# Patient Record
Sex: Female | Born: 1980 | Hispanic: Yes | Marital: Married
Health system: Southern US, Community
[De-identification: ages and names within clinical notes are randomized; demographics above are authoritative.]

---

## 2005-07-20 ENCOUNTER — Ambulatory Visit: Payer: Self-pay | Admitting: Nurse Practitioner

## 2005-10-24 ENCOUNTER — Ambulatory Visit: Payer: Self-pay | Admitting: Family Medicine

## 2006-03-21 ENCOUNTER — Inpatient Hospital Stay: Payer: Self-pay | Admitting: Obstetrics and Gynecology

## 2006-03-29 ENCOUNTER — Emergency Department: Payer: Self-pay | Admitting: Emergency Medicine

## 2006-04-28 ENCOUNTER — Emergency Department: Payer: Self-pay | Admitting: Emergency Medicine

## 2009-12-21 ENCOUNTER — Ambulatory Visit: Payer: Self-pay | Admitting: Family Medicine

## 2010-06-05 ENCOUNTER — Ambulatory Visit: Payer: Self-pay | Admitting: Family Medicine

## 2010-06-12 ENCOUNTER — Ambulatory Visit: Payer: Self-pay | Admitting: Family Medicine

## 2010-06-17 ENCOUNTER — Emergency Department: Payer: Self-pay | Admitting: Emergency Medicine

## 2011-07-27 ENCOUNTER — Ambulatory Visit: Payer: Self-pay | Admitting: Family Medicine

## 2011-08-29 ENCOUNTER — Ambulatory Visit: Payer: Self-pay | Admitting: Family Medicine

## 2011-10-29 ENCOUNTER — Observation Stay: Payer: Self-pay | Admitting: Obstetrics and Gynecology

## 2011-10-29 LAB — CBC WITH DIFFERENTIAL/PLATELET
HCT: 28.1 % — ABNORMAL LOW (ref 35.0–47.0)
HGB: 10.2 g/dL — ABNORMAL LOW (ref 12.0–16.0)
Lymphocytes: 23 %
MCV: 98 fL (ref 80–100)
Metamyelocyte: 2 %
Monocytes: 4 %
Myelocyte: 1 %
NRBC/100 WBC: 4 /
RBC: 2.87 10*6/uL — ABNORMAL LOW (ref 3.80–5.20)
Segmented Neutrophils: 69 %
WBC: 9.5 10*3/uL (ref 3.6–11.0)

## 2011-10-29 LAB — URINALYSIS, COMPLETE
Bilirubin,UR: NEGATIVE
Glucose,UR: 50 mg/dL (ref 0–75)
Leukocyte Esterase: NEGATIVE
Nitrite: NEGATIVE
Ph: 7 (ref 4.5–8.0)
Protein: NEGATIVE
RBC,UR: 2 /HPF (ref 0–5)
Specific Gravity: 1.01 (ref 1.003–1.030)
Squamous Epithelial: NONE SEEN

## 2012-01-10 ENCOUNTER — Ambulatory Visit: Payer: Self-pay | Admitting: Obstetrics and Gynecology

## 2012-01-10 LAB — CBC WITH DIFFERENTIAL/PLATELET
Basophil: 1 %
Eosinophil: 2 %
HCT: 31.6 % — ABNORMAL LOW (ref 35.0–47.0)
HGB: 11.2 g/dL — ABNORMAL LOW (ref 12.0–16.0)
Lymphocytes: 25 %
MCV: 101 fL — ABNORMAL HIGH (ref 80–100)
Metamyelocyte: 2 %
Monocytes: 4 %
Myelocyte: 2 %
RBC: 3.15 10*6/uL — ABNORMAL LOW (ref 3.80–5.20)
WBC: 6.8 10*3/uL (ref 3.6–11.0)

## 2012-01-11 ENCOUNTER — Inpatient Hospital Stay: Payer: Self-pay | Admitting: Obstetrics and Gynecology

## 2012-01-11 LAB — CBC WITH DIFFERENTIAL/PLATELET
Bands: 7 %
Eosinophil: 3 %
HCT: 27.7 % — ABNORMAL LOW (ref 35.0–47.0)
Lymphocytes: 20 %
MCH: 34.6 pg — ABNORMAL HIGH (ref 26.0–34.0)
MCHC: 34.5 g/dL (ref 32.0–36.0)
MCV: 100 fL (ref 80–100)
Metamyelocyte: 1 %
Monocytes: 5 %
NRBC/100 WBC: 12 /
NRBC/100 WBC: 16 /
Platelet: 158 10*3/uL (ref 150–440)
WBC: 8.3 10*3/uL (ref 3.6–11.0)

## 2012-01-11 LAB — PROTIME-INR: Prothrombin Time: 12 secs (ref 11.5–14.7)

## 2012-01-12 LAB — CBC WITH DIFFERENTIAL/PLATELET
Bands: 6 %
HGB: 7.8 g/dL — ABNORMAL LOW (ref 12.0–16.0)
MCH: 31.7 pg (ref 26.0–34.0)
MCHC: 35.2 g/dL (ref 32.0–36.0)
Monocytes: 4 %
RDW: 21.9 % — ABNORMAL HIGH (ref 11.5–14.5)
Segmented Neutrophils: 77 %
WBC: 12.3 10*3/uL — ABNORMAL HIGH (ref 3.6–11.0)

## 2012-01-12 LAB — HEMATOCRIT
HCT: 21.4 % — ABNORMAL LOW (ref 35.0–47.0)
HCT: 20.1 % — ABNORMAL LOW (ref 35.0–47.0)

## 2012-01-13 LAB — CBC WITH DIFFERENTIAL/PLATELET
Bands: 3 %
Eosinophil: 1 %
HCT: 19.8 % — ABNORMAL LOW (ref 35.0–47.0)
HGB: 7.1 g/dL — ABNORMAL LOW (ref 12.0–16.0)
Lymphocytes: 23 %
MCHC: 35.9 g/dL (ref 32.0–36.0)
Metamyelocyte: 6 %
Monocytes: 4 %
NRBC/100 WBC: 7 /
RBC: 2.21 10*6/uL — ABNORMAL LOW (ref 3.80–5.20)
RDW: 22.3 % — ABNORMAL HIGH (ref 11.5–14.5)
Segmented Neutrophils: 61 %

## 2012-01-13 LAB — BASIC METABOLIC PANEL
Anion Gap: 7 (ref 7–16)
BUN: 11 mg/dL (ref 7–18)
Co2: 26 mmol/L (ref 21–32)
EGFR (African American): >60
EGFR (Non-African Amer.): >60
Glucose: 68 mg/dL (ref 65–99)
Osmolality: 281 (ref 275–301)

## 2012-01-14 LAB — BASIC METABOLIC PANEL
Anion Gap: 7 (ref 7–16)
BUN: 7 mg/dL (ref 7–18)
Calcium, Total: 8.4 mg/dL — ABNORMAL LOW (ref 8.5–10.1)
EGFR (African American): >60
EGFR (Non-African Amer.): >60
Glucose: 66 mg/dL (ref 65–99)
Potassium: 3.7 mmol/L (ref 3.5–5.1)
Sodium: 139 mmol/L (ref 136–145)

## 2012-01-14 LAB — CBC WITH DIFFERENTIAL/PLATELET
Bands: 5 %
Eosinophil: 2 %
HCT: 26.5 % — ABNORMAL LOW (ref 35.0–47.0)
HGB: 9.3 g/dL — ABNORMAL LOW (ref 12.0–16.0)
Lymphocytes: 26 %
MCH: 31.3 pg (ref 26.0–34.0)
MCHC: 35.1 g/dL (ref 32.0–36.0)
Monocytes: 3 %
NRBC/100 WBC: 7 /
Platelet: 141 10*3/uL — ABNORMAL LOW (ref 150–440)
RBC: 2.96 10*6/uL — ABNORMAL LOW (ref 3.80–5.20)
Segmented Neutrophils: 62 %
WBC: 14.5 10*3/uL — ABNORMAL HIGH (ref 3.6–11.0)

## 2012-01-15 LAB — CBC WITH DIFFERENTIAL/PLATELET
Eosinophil: 2 %
HCT: 27.1 % — ABNORMAL LOW (ref 35.0–47.0)
MCH: 30.3 pg (ref 26.0–34.0)
MCHC: 33.5 g/dL (ref 32.0–36.0)
MCV: 91 fL (ref 80–100)
Myelocyte: 1 %
NRBC/100 WBC: 7 /
RDW: 19.1 % — ABNORMAL HIGH (ref 11.5–14.5)

## 2012-02-01 ENCOUNTER — Ambulatory Visit: Payer: Self-pay | Admitting: Internal Medicine

## 2012-02-05 LAB — CBC CANCER CENTER
Basophil #: 0.1 x10 3/mm (ref 0.0–0.1)
Eosinophil #: 0.2 x10 3/mm (ref 0.0–0.7)
HCT: 39.8 % (ref 35.0–47.0)
HGB: 13.9 g/dL (ref 12.0–16.0)
Lymphocyte #: 1.9 x10 3/mm (ref 1.0–3.6)
Lymphocyte %: 26.4 %
MCHC: 35 g/dL (ref 32.0–36.0)
Monocyte #: 0.4 x10 3/mm (ref 0.2–0.9)
Monocyte %: 5.4 %
Neutrophil #: 4.6 x10 3/mm (ref 1.4–6.5)
Neutrophil %: 63.8 %
Platelet: 240 x10 3/mm (ref 150–440)
WBC: 7.3 x10 3/mm (ref 3.6–11.0)

## 2012-02-05 LAB — PLATELET FUNCTION ASSAY: COL/EPI PLT FXN SCRN: 114 Seconds

## 2012-02-05 LAB — COMPREHENSIVE METABOLIC PANEL
Albumin: 3.7 g/dL (ref 3.4–5.0)
Anion Gap: 9 (ref 7–16)
BUN: 14 mg/dL (ref 7–18)
Calcium, Total: 9.3 mg/dL (ref 8.5–10.1)
Chloride: 103 mmol/L (ref 98–107)
Creatinine: 0.64 mg/dL (ref 0.60–1.30)
EGFR (African American): >60
EGFR (Non-African Amer.): >60
Glucose: 75 mg/dL (ref 65–99)
SGOT(AST): 18 U/L (ref 15–37)
SGPT (ALT): 30 U/L (ref 12–78)

## 2012-02-05 LAB — PROTIME-INR: INR: 1

## 2012-02-05 LAB — APTT: Activated PTT: 31.8 secs (ref 23.6–35.9)

## 2012-03-01 ENCOUNTER — Ambulatory Visit: Payer: Self-pay | Admitting: Internal Medicine

## 2012-07-29 ENCOUNTER — Ambulatory Visit: Payer: Self-pay | Admitting: Internal Medicine

## 2012-08-29 ENCOUNTER — Ambulatory Visit: Payer: Self-pay | Admitting: Internal Medicine

## 2012-11-14 IMAGING — US US OB US >=[ID] SNGL FETUS
1 series · 13 of 28 positions shown · non-contrast
Comparison: none

REASON FOR EXAM: Eval growth and review anatomy
COMMENTS:

[Series 1: us ob us >=(id) sngl fetus · 0.25mm/px · 13 of 83 slices shown]
[im 4/83]
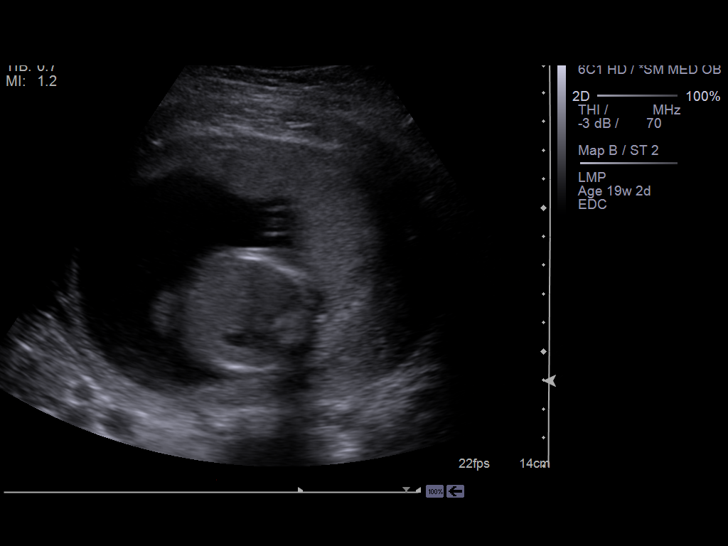
[im 10/83]
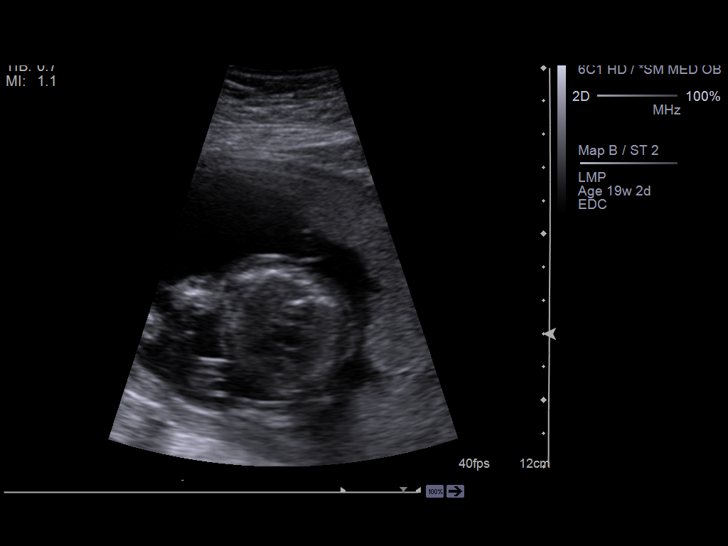
[im 16/83]
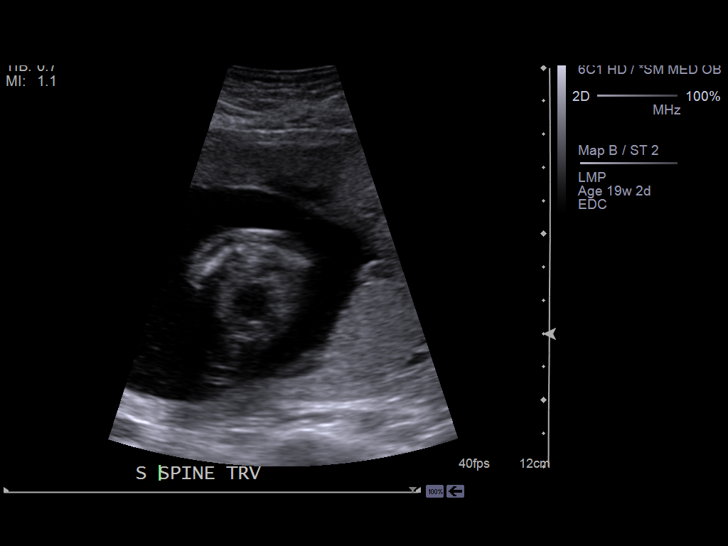
[im 22/83]
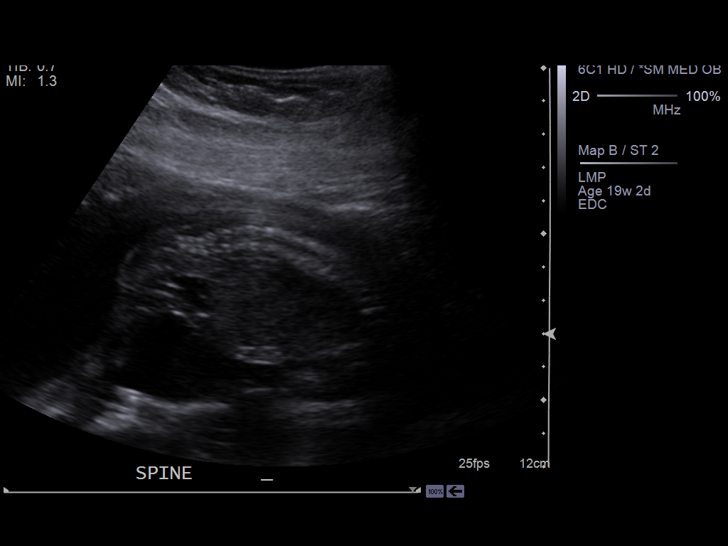
[im 28/83]
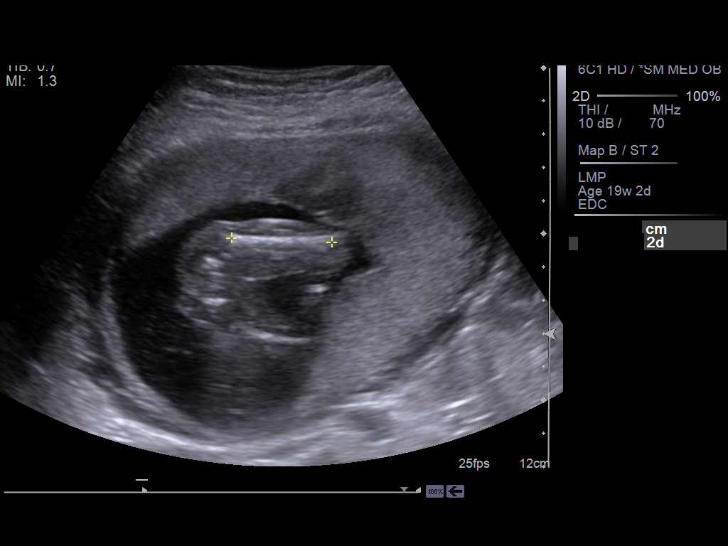
[im 34/83]
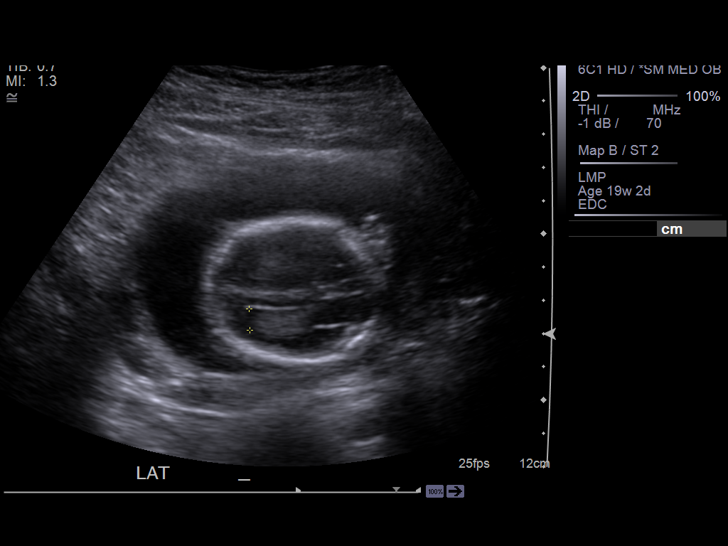
[im 43/83]
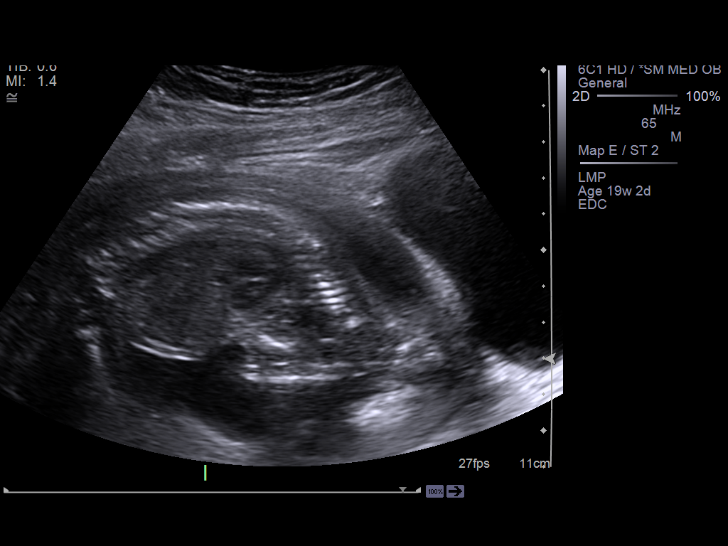
[im 49/83]
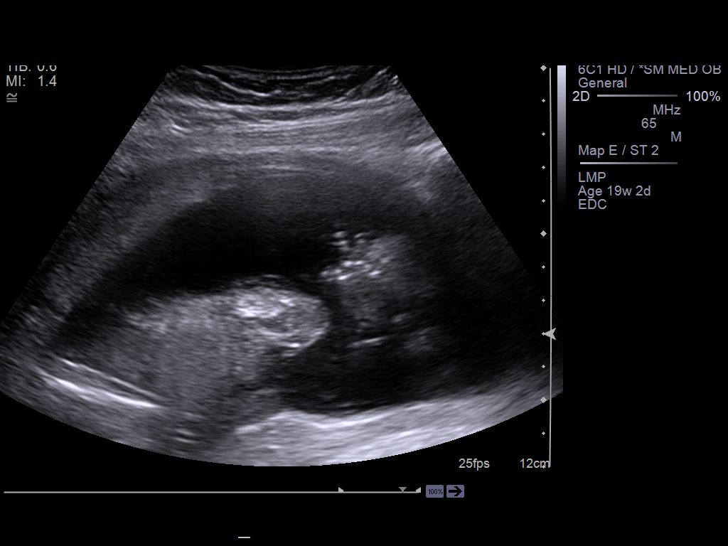
[im 55/83]
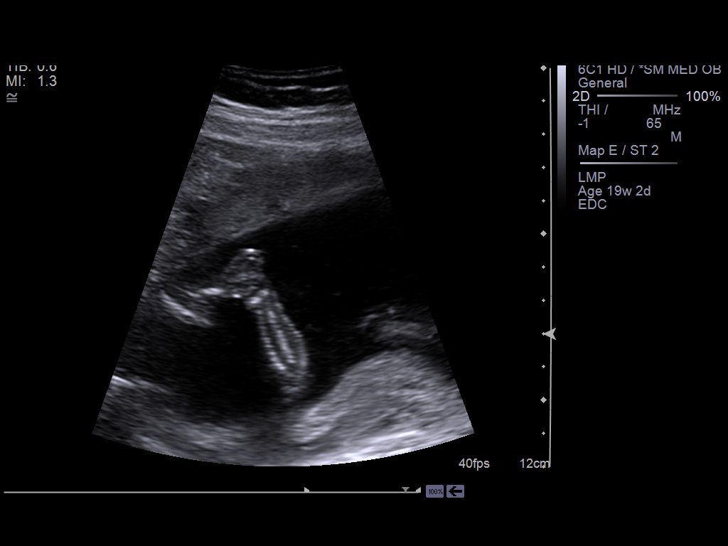
[im 61/83]
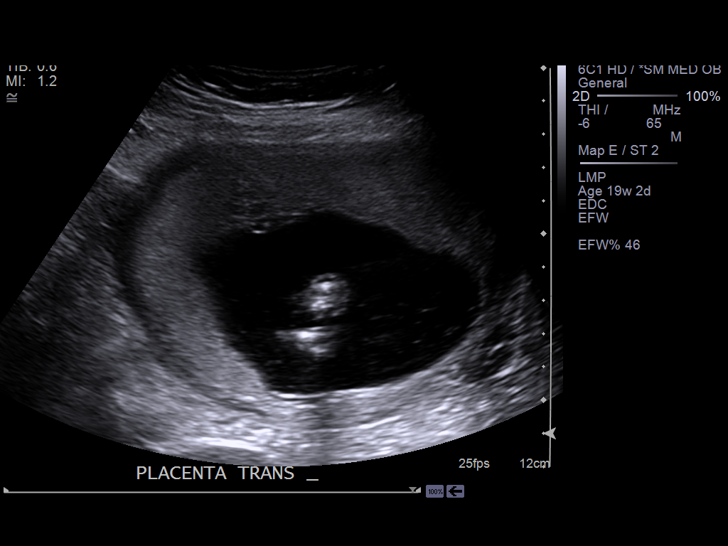
[im 67/83]
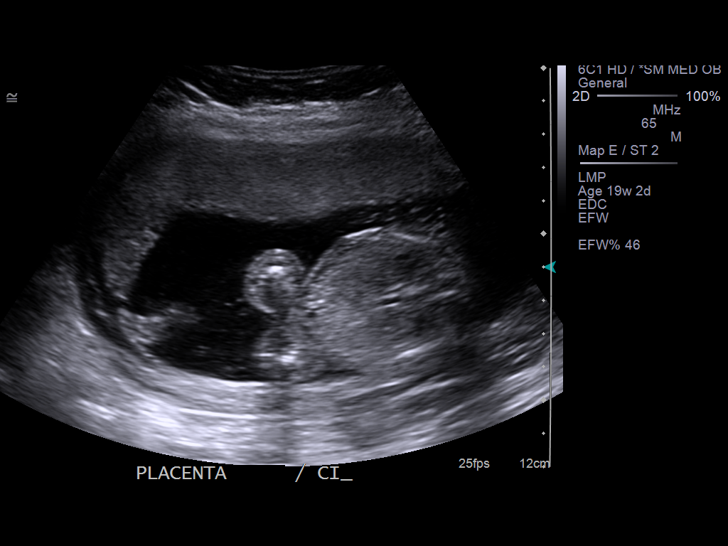
[im 73/83]
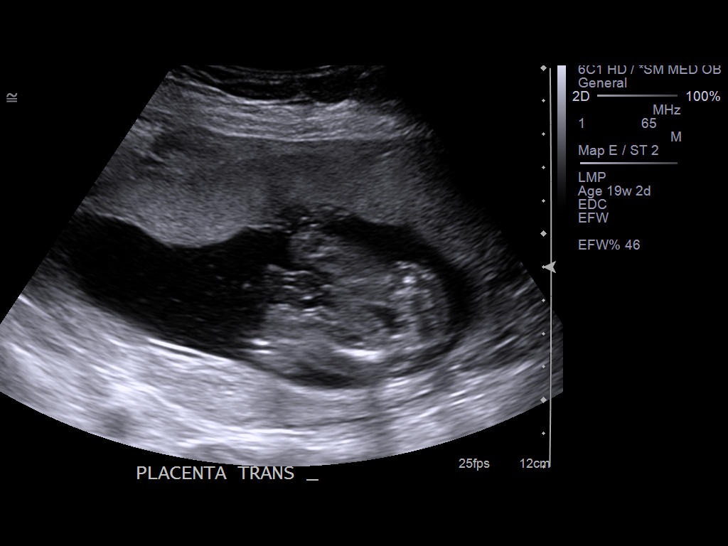
[im 79/83]
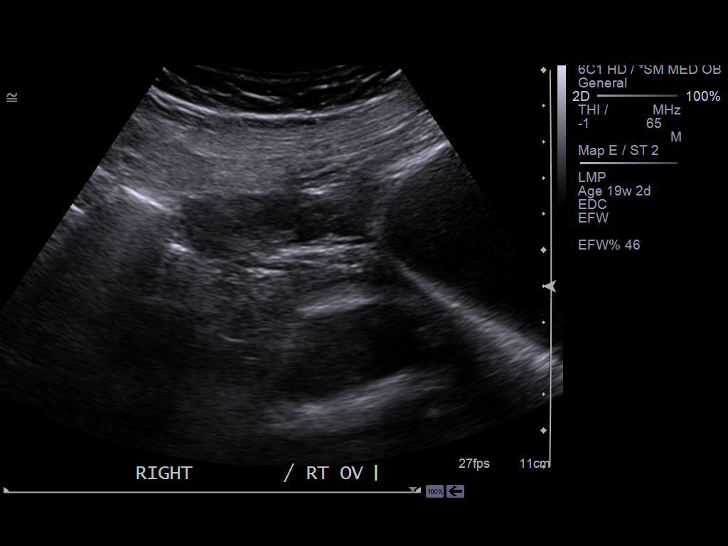

[13 of 28 positions shown; findings below may reference images not displayed]

PROCEDURE:     US  - US OB GREATER/OR EQUAL TO [id number]  - August 29, 2011  [DATE]

RESULT:     There is a gravid uterus present. The fetus is cephalic. A fetal
cardiac rate of 144 beats permit was demonstrated. The placenta is anterior
and fundal. The distance from the marked lower margin of the placenta to the
internal cervical os is 7.1 cm.

The amniotic fluid volume is estimated to be normal. The fetal stomach,
kidneys, and urinary bladder were demonstrated and appeared normal. The
intracranial structures, the craniocervical junction, and the spinal
structures are normal in appearance. A 4 chambered heart could not be well
demonstrated due to the fetal lie.

Measured parameters:
BPD 4.36 cm corresponding to an EGA of 19 weeks one day
HC 16.47 cm corresponding to an EGA of 19 weeks one day
AC 13.94 cm corresponding to an EGA of 19 weeks 2 days
FL 3.01 cm corresponding to an EGA of 19 weeks 2 days.
IMPRESSION: There is a viable IUP with estimated gestational age of 19
weeks 2 days plus or minus approximately 12 days. The estimated date of
confinement is 21 January, 2012 which is in good agreement with the
patient's clinical dating. The estimated fetal weight is 285 grams + / - 42
grams. No fetal anomalies were demonstrated.

[REDACTED]

## 2013-01-14 IMAGING — US US OB LIMITED
1 series · 14 of 28 positions shown · non-contrast
Comparison: none

REASON FOR EXAM: vaginal bleeding
COMMENTS:   May transport without cardiac monitor

[Series 1: us ob limited · 0.23mm/px · 14 of 29 slices shown]
[im 2/29]
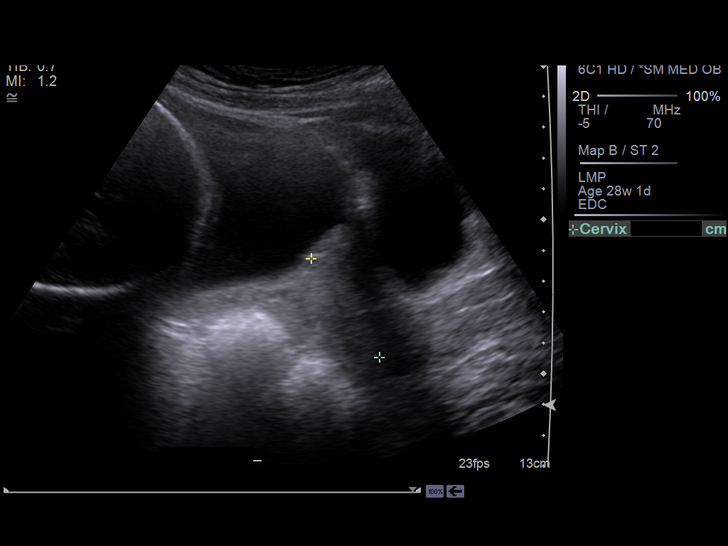
[im 4/29]
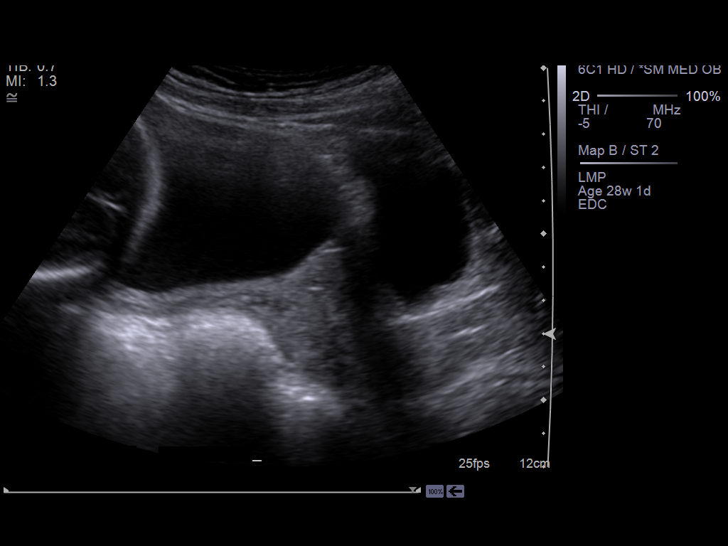
[im 6/29]
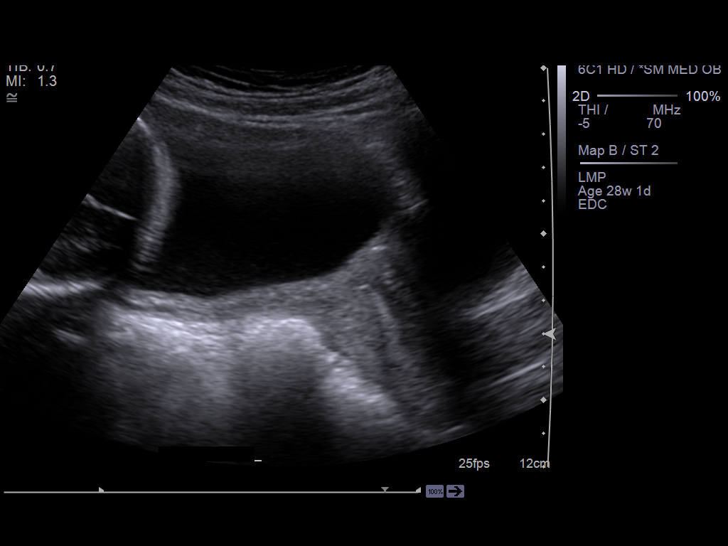
[im 8/29]
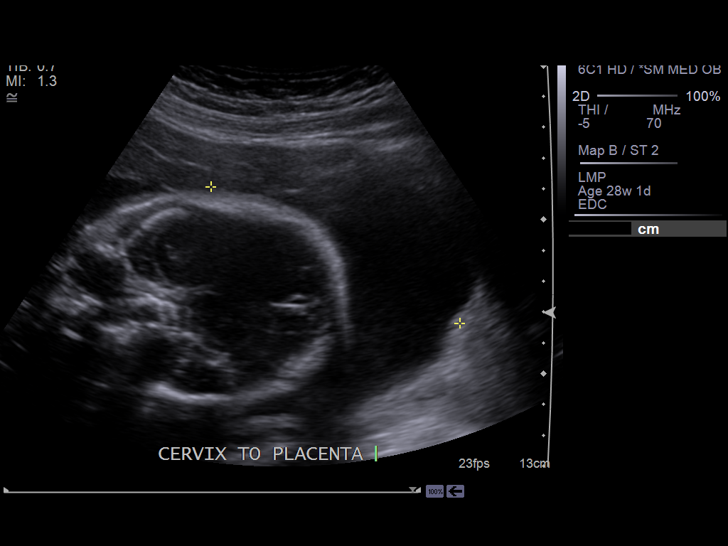
[im 10/29]
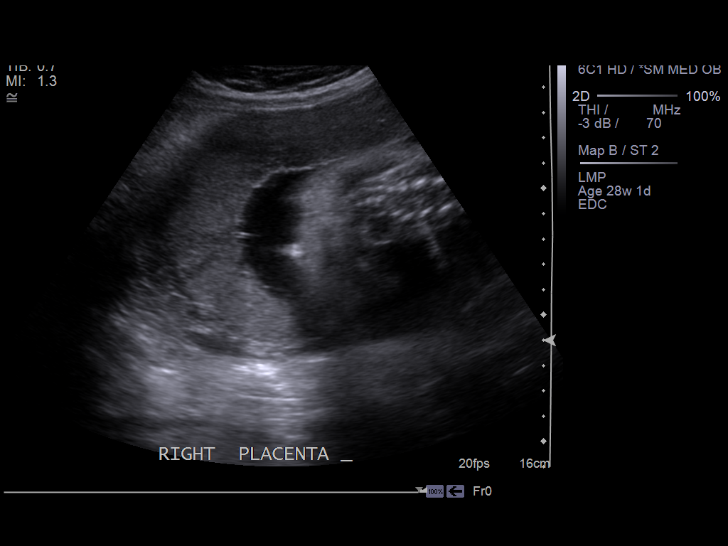
[im 12/29]
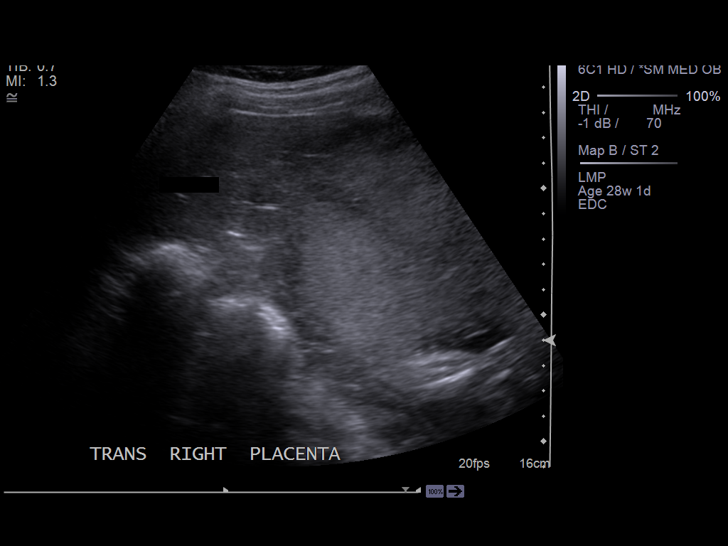
[im 14/29]
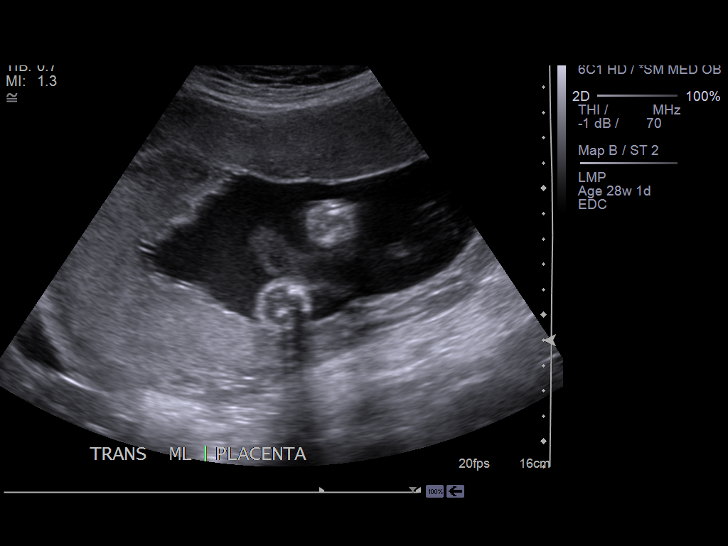
[im 16/29]
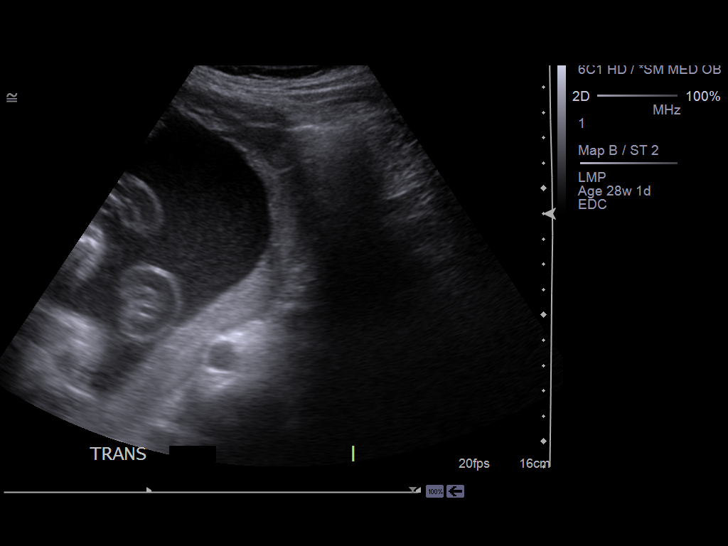
[im 18/29]
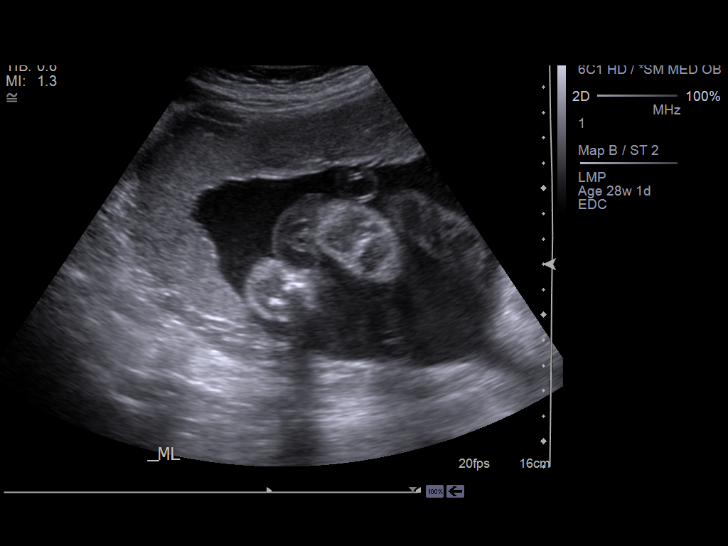
[im 20/29]
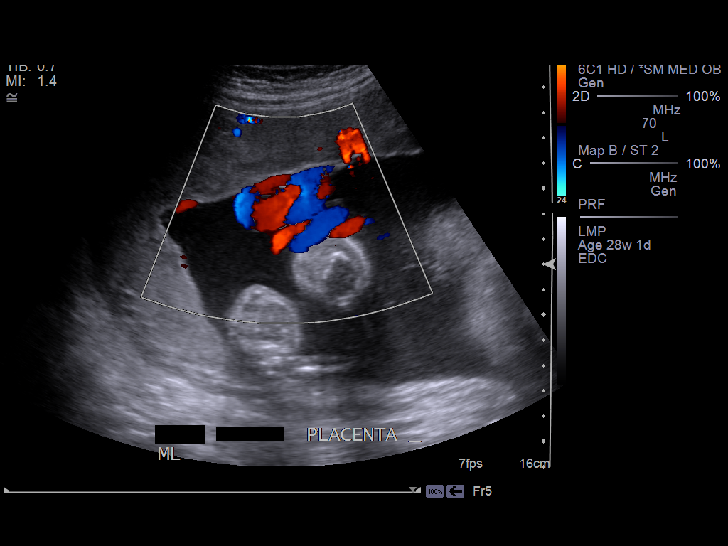
[im 22/29]
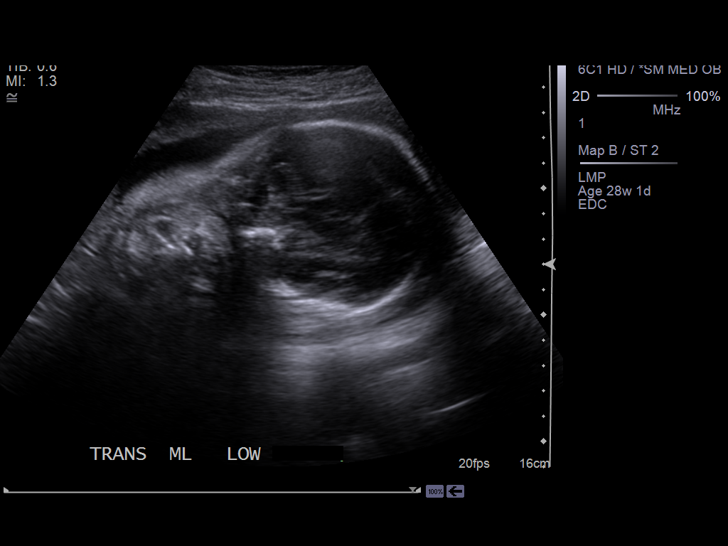
[im 24/29]
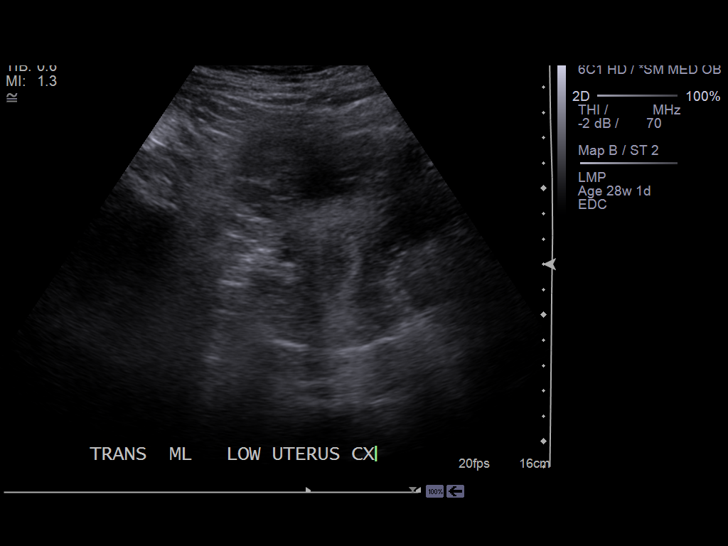
[im 26/29]
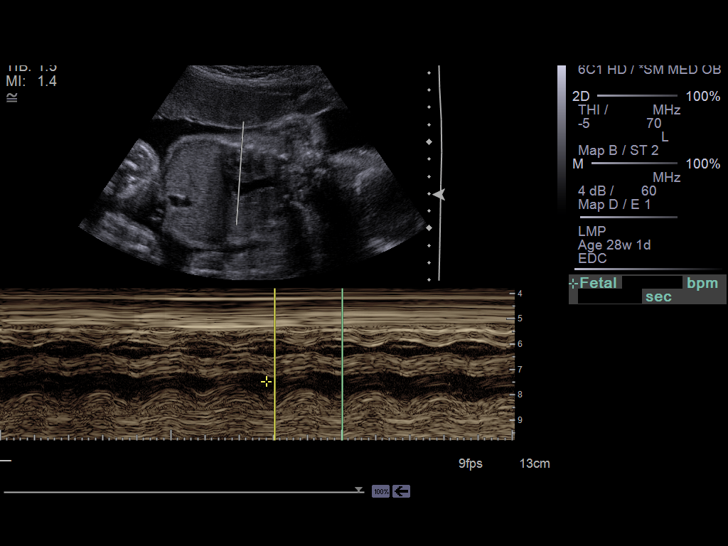
[im 29/29]
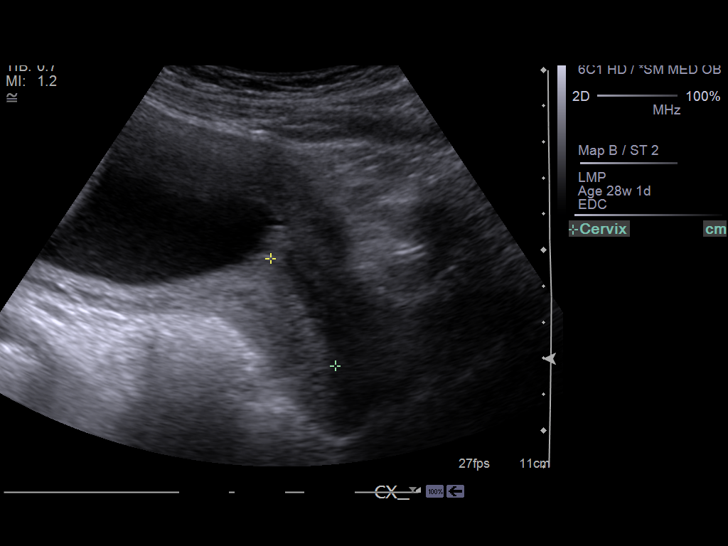

[14 of 28 positions shown; findings below may reference images not displayed]

PROCEDURE:     US  - US LIMITED OB  - October 29, 2011 [DATE]

RESULT:     A limited OB ultrasound was performed to evaluate the cervix and
placenta and determine the fetal cardiac rate.

There is a gravid uterus with cephalic presentation. The amniotic fluid
volume appears normal. The placenta is fundal, anterior and right-sided. The
distance from the inferior placental surface to the internal cervical os is
9.2 cm. The cervical length is 3.47 cm. The fetal cardiac rate is 155 beats
per minute.
IMPRESSION: 1. The fetus is viable with a cardiac rate of 155 beats per minute.
2. The placenta is anterior, right-sided, end exhibits no evidence of
placenta previa or abruption.
3. The amniotic fluid volume is estimated to be normal.

[REDACTED]

## 2013-05-15 ENCOUNTER — Emergency Department: Payer: Self-pay | Admitting: Emergency Medicine

## 2013-05-15 LAB — URINALYSIS, COMPLETE
BILIRUBIN, UR: NEGATIVE
BLOOD: NEGATIVE
Bacteria: NONE SEEN
Glucose,UR: NEGATIVE mg/dL (ref 0–75)
Ketone: NEGATIVE
Leukocyte Esterase: NEGATIVE
Nitrite: NEGATIVE
Ph: 6 (ref 4.5–8.0)
Protein: NEGATIVE
RBC,UR: <1 /HPF (ref 0–5)
Specific Gravity: 1.015 (ref 1.003–1.030)
Squamous Epithelial: 2
WBC UR: 1 /HPF (ref 0–5)

## 2013-05-15 LAB — CBC
HCT: 39 % (ref 35.0–47.0)
HGB: 13.1 g/dL (ref 12.0–16.0)
MCH: 30.7 pg (ref 26.0–34.0)
MCHC: 33.6 g/dL (ref 32.0–36.0)
MCV: 91 fL (ref 80–100)
PLATELETS: 191 10*3/uL (ref 150–440)
RBC: 4.27 10*6/uL (ref 3.80–5.20)
RDW: 12.3 % (ref 11.5–14.5)
WBC: 12.6 10*3/uL — ABNORMAL HIGH (ref 3.6–11.0)

## 2013-05-15 LAB — BASIC METABOLIC PANEL
Anion Gap: 5 — ABNORMAL LOW (ref 7–16)
BUN: 16 mg/dL (ref 7–18)
Calcium, Total: 8.9 mg/dL (ref 8.5–10.1)
Chloride: 108 mmol/L — ABNORMAL HIGH (ref 98–107)
Co2: 25 mmol/L (ref 21–32)
Creatinine: 0.49 mg/dL — ABNORMAL LOW (ref 0.60–1.30)
EGFR (African American): >60
EGFR (Non-African Amer.): >60
GLUCOSE: 113 mg/dL — AB (ref 65–99)
OSMOLALITY: 278 (ref 275–301)
POTASSIUM: 3.6 mmol/L (ref 3.5–5.1)
Sodium: 138 mmol/L (ref 136–145)

## 2013-06-05 ENCOUNTER — Ambulatory Visit: Payer: Self-pay | Admitting: Family Medicine

## 2013-06-21 ENCOUNTER — Emergency Department: Payer: Self-pay | Admitting: Emergency Medicine

## 2014-05-18 NOTE — Discharge Summary (Signed)
PATIENT NAME:  Susan Robinson, Kadeshia MR#:  [id number] DATE OF BIRTH:  [birth year]  DATE OF ADMISSION:  01/11/2012 DATE OF DISCHARGE:  01/15/2012  PRINCIPLE PROCEDURE: Primary low transverse cesarean section.   HOSPITAL COURSE: The patient underwent a primary low transverse cesarean section for prior history of fourth degree laceration with her past delivery. During the procedure, the patient had slow oozing that prompted removal of the On-Q pump system shortly after delivery. The patient's preop hematocrit was 31.6%. The patient, the day of surgery, dropped her hematocrit to 27.7% and later day of surgery hematocrit was 19.9%. The patient received 2 units of blood for presumed continued oozing although the patient remained clinically stable throughout her hospitalization. The patient did have von Willebrand's workup initiated shortly after surgery given the potential for the cause of her coagulopathy. The patient's urine output did drop the day of surgery which required IV fluid bolus, postoperative day #1 hematocrit of 21.4%, later that day hematocrit dropped down to 20.1%. Postoperative day #2, given that patient still had a low hematocrit and she had a suboptimal increase of her hematocrit after her 2 units initially, cryoprecipitate and FFP and 2 additional units of blood were administered for presumed coagulopathy. Ultimately, her hematocrit returned to 26.5% and postoperative day #4 it was stable at 27.1%. The patient's past history and family history is consistent with a coagulopathy. Fibrinogen level during hospitalization was normal. PT and PTT normal. Factor VIII activity was 187% and von Willebrand's factor 148%, von Willebrand's factor antigen 311%.   DISPOSITION:  The patient is discharged to home in good condition.   DISCHARGE INSTRUCTIONS:  It is our recommendation that the patient have a reliable form of contraception and not conceive again given her 2 complicated deliveries. I will follow up  the patient in 2 weeks and will initiate a hematology consultation for coagulopathy.   DISCHARGE MEDICATIONS:  The patient will be discharged with Colace 1 tablet daily. Norco 5/325, 1 to 2 tablets every 4 to 6 hours and anti-inflammatory choice over-the-counter.        ____________________________ Suzy Bouchardhomas J. Daleyssa Loiselle, MD tjs:cs D: 01/15/2012 09:21:28 ET T: 01/15/2012 20:23:59 ET JOB#: [id number]  cc: Suzy Bouchardhomas J. Heather Mckendree, MD, <Dictator> Suzy BouchardHOMAS J Teia Freitas MD ELECTRONICALLY SIGNED 01/28/2012 22:24

## 2014-05-18 NOTE — Op Note (Signed)
PATIENT NAME:  Susan Robinson, Susan Robinson MR#:  [id number] DATE OF BIRTH:  06-13-1980  DATE OF PROCEDURE:  01/11/2012  PREOPERATIVE DIAGNOSES:  1. A 39-week gestation.  2. Prior history of fourth-degree laceration with defecation dysfunction.   POSTOPERATIVE DIAGNOSES:  1. A 39-week gestation.  2. Prior history of fourth-degree laceration with defecation dysfunction.   PROCEDURE: Primary low transverse cesarean section.   ANESTHESIA: Spinal.   SURGEON: Suzy Bouchardhomas J Schermerhorn, MD   FIRST ASSISTANYetta Barre: Jones   INDICATIONS: This is a 34 year old gravida 2, para 1 patient with EDC of 01/18/2012. The patient is with a history of a forceps delivery with a fourth-degree laceration with continued dysfunction of rectum and defecation. The patient elects for primary cesarean section.   PROCEDURE: After adequate spinal anesthesia, the patient was placed in the dorsal supine position, hip roll under the right side. The patient's abdomen was prepped and draped in normal sterile fashion. The patient did receive 2 grams IV Ancef prior to commencement of the case. A Pfannenstiel incision was made two fingerbreadths above the symphysis pubis. Sharp dissection was used to identify the fascia. The fascia was opened in the midline and opened in a transverse fashion. The superior aspect of the fascia was grasped with Kocher clamps and the recti muscles dissected free. The inferior aspect of the fascia was grasped with Kocher clamps, and the pyramidalis muscle was dissected free. Entry into the peritoneal cavity was accomplished sharply. The vesicouterine peritoneal fold was identified, a bladder flap was created, and the bladder was reflected inferiorly. A low transverse uterine incision was made. Upon entry into the endometrial cavity, clear fluid resulted. A large head was brought to the incision, vacuum applied to the occiput. One gentle pull with the vacuum allowed for delivery of the head. A loose nuchal cord was reduced.  The shoulders and body were delivered without difficulty, a vigorous female. The cord was doubly clamped, and the infant was passed to Dr. Beckie Saltsasnadi who assigned Apgar scores of 9 and 9. The placenta was manually delivered. The uterus was exteriorized and the endometrial cavity was wiped clean with a laparotomy tape. Ring forceps was used to open the cervix, and this was passed off the operative field. The uterine incision was closed with 1 chromic in a running locking fashion. Several additional figure-of-eights were used for oozing at the incision line. Good hemostasis was noted. The fallopian tubes and ovaries appeared normal. The posterior cul-de-sac was irrigated and suctioned. The uterus was placed back into the abdominal cavity. There was some generalized oozing from the right rectus muscle that required several figure-of-eight 3-0 Vicryl sutures and Arista placement. The superior aspect of the fascia was grasped with Kocher clamps again, and the On-Q pump catheters were advanced subfascially without difficulty. The fascia was then closed above this with 0 Vicryl suture in a running nonlocking fashion. Subcutaneous tissues were irrigated and bovied for hemostasis, and the skin was reapproximated with staples. Dermabond and a pressure dressing were applied to the On-Q pump catheters, Steri-Stripped down to the skin, and each catheter was loaded with 5 mL of 0.5% Marcaine. Estimated blood loss was 600 mL. Intraoperative fluids were 1500 mL. The patient tolerated the procedure well and was taken to the recovery room in good condition.   ____________________________ Suzy Bouchardhomas J. Schermerhorn, MD tjs:cbb D: 01/11/2012 08:48:13 ET T: 01/11/2012 09:58:06 ET JOB#: [id number]  cc: Suzy Bouchardhomas J. Schermerhorn, MD, <Dictator> Suzy BouchardHOMAS J SCHERMERHORN MD ELECTRONICALLY SIGNED 01/28/2012 22:23

## 2015-08-02 ENCOUNTER — Emergency Department
Admission: EM | Admit: 2015-08-02 | Discharge: 2015-08-02 | Disposition: A | Discharge status: Home / Self Care | Payer: Self-pay | Attending: Emergency Medicine | Admitting: Emergency Medicine

## 2015-08-02 DIAGNOSIS — R51 Headache: Secondary | ICD-10-CM | POA: Insufficient documentation

## 2015-08-02 DIAGNOSIS — R519 Headache, unspecified: Secondary | ICD-10-CM

## 2015-08-02 LAB — BASIC METABOLIC PANEL
Anion gap: 8 (ref 5–15)
BUN: 11 mg/dL (ref 6–20)
CHLORIDE: 105 mmol/L (ref 101–111)
CO2: 23 mmol/L (ref 22–32)
Calcium: 9.4 mg/dL (ref 8.9–10.3)
Creatinine, Ser: 0.63 mg/dL (ref 0.44–1.00)
GFR calc Af Amer: >60 mL/min (ref >60)
GFR calc non Af Amer: >60 mL/min (ref >60)
GLUCOSE: 141 mg/dL — AB (ref 65–99)
POTASSIUM: 3.4 mmol/L — AB (ref 3.5–5.1)
Sodium: 136 mmol/L (ref 135–145)

## 2015-08-02 LAB — CBC
HEMATOCRIT: 38.9 % (ref 35.0–47.0)
HEMOGLOBIN: 13.8 g/dL (ref 12.0–16.0)
MCH: 30.4 pg (ref 26.0–34.0)
MCHC: 35.4 g/dL (ref 32.0–36.0)
MCV: 85.9 fL (ref 80.0–100.0)
Platelets: 168 10*3/uL (ref 150–440)
RBC: 4.53 MIL/uL (ref 3.80–5.20)
RDW: 12.9 % (ref 11.5–14.5)
WBC: 8.6 10*3/uL (ref 3.6–11.0)

## 2015-08-02 LAB — TROPONIN I

## 2015-08-02 MED ORDER — ACETAMINOPHEN 325 MG PO TABS: 650.0000 mg | ORAL_TABLET | Freq: Once | ORAL | Status: DC

## 2015-08-02 MED ORDER — PROCHLORPERAZINE EDISYLATE 5 MG/ML IJ SOLN
10.0000 mg | Freq: Once | INTRAMUSCULAR | Status: AC
  Administered 2015-08-02: 10 mg via INTRAVENOUS
  Filled 2015-08-02: qty 2

## 2015-08-02 MED ORDER — DIPHENHYDRAMINE HCL 50 MG/ML IJ SOLN
25.0000 mg | Freq: Once | INTRAMUSCULAR | Status: AC
  Administered 2015-08-02: 25 mg via INTRAVENOUS
  Filled 2015-08-02: qty 1

## 2015-08-02 NOTE — Discharge Instructions (Signed)
Dolor de cabeza general sin causa °(General Headache Without Cause) °El dolor de cabeza es un dolor o malestar que se siente en la zona de la cabeza o del cuello. Hay muchas causas y tipos de dolores de cabeza. En algunos casos, es posible que no se encuentre la causa.  °CUIDADOS EN EL HOGAR  °Control del dolor °· Tome los medicamentos de venta libre y los recetados solamente como se lo haya indicado el médico. °· Cuando sienta dolor de cabeza acuéstese en un cuarto oscuro y tranquilo. °· Si se lo indican, aplique hielo sobre la cabeza y la zona del cuello: °¨ Ponga el hielo en una bolsa plástica. °¨ Coloque una toalla entre la piel y la bolsa de hielo. °¨ Coloque el hielo durante 20 minutos, 2 a 3 veces por día. °· Utilice una almohadilla térmica o tome una ducha con agua caliente para aplicar calor en la cabeza y la zona del cuello como se lo haya indicado el médico. °· Mantenga las luces tenues si le molesta las luces brillantes o sus dolores de cabeza empeoran. °Comida y bebida °· Mantenga un horario para las comidas. °· Beba menos alcohol. °· Consuma menos o deje de tomar cafeína. °Instrucciones generales °· Concurra a todas las visitas de control como se lo haya indicado el médico. Esto es importante. °· Lleve un registro diario para averiguar si ciertas cosas provocan los dolores de cabeza. Por ejemplo, escriba los siguientes datos: °¨ Lo que usted come y bebe. °¨ Cuánto tiempo duerme. °¨ Algún cambio en su dieta o en los medicamentos. °· Realice actividades relajantes, como recibir masajes. °· Disminuya el nivel de estrés. °· Siéntese con la espalda recta. No contraiga (tensione) los músculos. °· No consuma productos que contengan tabaco. Estos incluyen cigarrillos, tabaco para mascar y cigarrillos electrónicos. Si necesita ayuda para dejar de fumar, consulte al médico. °· Haga ejercicios con regularidad tal como se lo indicó el médico. °· Duerma lo suficiente. Esto a menudo significa entre 7 y 9 horas de  sueño. °SOLICITE AYUDA SI: °· Los medicamentos no logran aliviar los síntomas. °· Tiene un dolor de cabeza que es diferente a los otros dolores de cabeza. °· Tiene malestar estomacal (náuseas) o vomita. °· Tiene fiebre. °SOLICITE AYUDA DE INMEDIATO SI:  °· El dolor de cabeza empeora. °· Sigue vomitando. °· Presenta rigidez en el cuello. °· Tiene dificultad para ver. °· Tiene dificultad para hablar. °· Siente dolor en el ojo o en el oído. °· Sus músculos están débiles, o pierde el control muscular. °· Pierde el equilibrio o tiene problemas para caminar. °· Siente que se desvanece (pierde el conocimiento) o se desmaya. °· Se siente confundido. °  °Esta información no tiene como fin reemplazar el consejo del médico. Asegúrese de hacerle al médico cualquier pregunta que tenga. °  °Document Released: 04/09/2011 Document Revised: 10/06/2014 °Elsevier Interactive Patient Education ©2016 [employer] ° °

## 2015-08-02 NOTE — ED Notes (Signed)
Patient c/o headache

## 2015-08-02 NOTE — ED Notes (Addendum)
Pt in with co headache hx of headaches in the past and was related to htn. Pt has not taken bp med since yest due to antibiotics so bp has been high today. Pt has also had chest pressure and dizziness.

## 2015-08-02 NOTE — ED Provider Notes (Addendum)
[facility] Emergency Department Provider Note  ____________________________________________   I have reviewed the triage vital signs and the nursing notes.   HISTORY  Chief Complaint Headache    HPI Susan Robinson is a 35 y.o. female with a history of recurrent headaches which she has had multiple times in the past she has had negative MRIs and CTs for this. Patient states that she has been off her blood pressure for last few days because she is taking medication for H. pylori and she is worried that she could not mix the 2. Patient takes verapamil. She is being on the verapamil, she is not had any headaches but now that she is off she did have a breakthrough headache today. Same headache as she is multiply had. She denies any focal numbness or weakness. Gradual onset. Not worst headache of life. Worse when she moves. No true vertigo. Patient speaks Spanish. I offered her an interpreter she is very comfortable with my Spanish and she does understand that anytime she wishes to she can call the interpreter Richard prefer not to at this time. Patient denies any fever or stiff neck. Her blood pressure is been trending down since she has been here, she states that she feels much better. I asked her about chest pain she states that she was having epigastric abdominal pain from her H. pylori but that is gone. She denies any actual chest which was breath nausea or vomiting. Patient states that she has had no recent illness, no focal neurologic complaint denies head injury difficulty speaking or walking. Her headache is mostly gone at this time.    No past medical history on file.  There are no active problems to display for this patient.   No past surgical history on file.  No current outpatient prescriptions on file.  Allergies Review of patient's allergies indicates no known allergies.  No family history on file.  Social History Social History  Substance Use  Topics  . Smoking status: Not on file  . Smokeless tobacco: Not on file  . Alcohol Use: Not on file    Review of Systems Constitutional: No fever/chills Eyes: No visual changes. ENT: No sore throat. No stiff neck no neck pain Cardiovascular: Denies chest pain. Respiratory: Denies shortness of breath. Gastrointestinal:   no vomiting.  No diarrhea.  No constipation. Genitourinary: Negative for dysuria. Musculoskeletal: Negative lower extremity swelling Skin: Negative for rash. Neurological: Negative for unusual headaches, focal weakness or numbness. 10-point ROS otherwise negative.  ____________________________________________   PHYSICAL EXAM:  VITAL SIGNS: ED Triage Vitals  Enc Vitals Group     BP 08/02/15 2031 172/114 mmHg     Pulse Rate 08/02/15 2031 76     Resp 08/02/15 2031 18     Temp 08/02/15 2031 98.2 F (36.8 C)     Temp Source 08/02/15 2031 Oral     SpO2 08/02/15 2031 100 %     Weight 08/02/15 2031 125 lb (56.7 kg)     Height 08/02/15 2031 4\' 11"  (1.499 m)     Head Cir --      Peak Flow --      Pain Score 08/02/15 2032 10     Pain Loc --      Pain Edu? --      Excl. in GC? --     Constitutional: Alert and oriented. Well appearing and in no acute distress. Eyes: Conjunctivae are normal. PERRL. EOMI. Head: Atraumatic. Nose: No congestion/rhinnorhea. Mouth/Throat: Mucous  membranes are moist.  Oropharynx non-erythematous. Neck: No stridor.   Nontender with no meningismus Cardiovascular: Normal rate, regular rhythm. Grossly normal heart sounds.  Good peripheral circulation. Respiratory: Normal respiratory effort.  No retractions. Lungs CTAB. Abdominal: Soft and nontender. No distention. No guarding no rebound Back:  There is no focal tenderness or step off there is no midline tenderness there are no lesions noted. there is no CVA tenderness Musculoskeletal: No lower extremity tenderness. No joint effusions, no DVT signs strong distal pulses no  edema Neurologic:  Cranial nerves II through XII are grossly intact 5 out of 5 strength bilateral upper and lower extremity. Finger to nose within normal limits heel to shin within normal limits, speech is normal with no word finding difficulty or dysarthria, reflexes symmetric, pupils are equally round and reactive to light, there is no pronator drift, sensation is normal, vision is intact to confrontation, gait is deferred, there is no nystagmus, normal neurologic exam.  Skin:  Skin is warm, dry and intact. No rash noted. Psychiatric: Mood and affect are mildly anxious ____________________________________________   LABS (all labs ordered are listed, but only abnormal results are displayed)  Labs Reviewed  BASIC METABOLIC PANEL - Abnormal; Notable for the following:    Potassium 3.4 (*)    Glucose, Bld 141 (*)    All other components within normal limits  CBC  TROPONIN I   ____________________________________________  EKG  I personally interpreted any EKGs ordered by me or triage Normal EKG, normal sinus rhythm at 74 bpm no acute ST elevation or acute ST depression normal axis  RADIOLOGY  I reviewed any imaging ordered by me or triage that were performed during my shift and, if possible, patient and/or family made aware of any abnormal findings. ____________________________________________   PROCEDURES  Procedure(s) performed: None  Critical Care performed: None  ____________________________________________   INITIAL IMPRESSION / ASSESSMENT AND PLAN / ED COURSE  Pertinent labs & imaging results that were available during my care of the patient were reviewed by me and considered in my medical decision making (see chart for details).  Patient with recurrent chronic headaches with extensive outpatient workup. Her blood pressure was somewhat elevated but she was not compliant with her medications, she did start taking it today. She is post to take it on a 3 times a day basis,  she had taken 2, her blood pressure is down to 150/90 which she believes to be close to her baseline. She has a normal nonfocal neurologic exam, not worst headache of life, gradual in onset. Do not think the patient requires emergent imaging or LP for this chronic recurrent headache. Patient did not have any chest pain, or shortness of breath. Nonetheless, they did obtain EKG and troponin which are reassuring. I do not think serial enzymes are required.  We will treat her chronic headaches with antimigraine medications to see if this helps her, patient is taking her third verapamil this time and we'll see she feels better.  ----------------------------------------- 11:19 PM on 08/02/2015 -----------------------------------------  Patient's headache is completely gone blood pressure is in the 130s. I've explained to the patient that it is unclear if the headache is caused by the high blood pressure or the blood pressure is caused by the headache or some combination of both. As certainly the case of patient's very anxious about her blood pressure and she was crying her neck to watch it every time it cycled. After migraine medication however her headache completely disappeared. I do suspect that  this could be some component of migraine and given the fact that she has an extensive imaging and do not think further imaging is warranted at this time. She remains neurologically intact at discharge. Patient will follow closely as an outpatient with neurology since return precautions given and understood in Spanish. ____________________________________________   FINAL CLINICAL IMPRESSION(S) / ED DIAGNOSES  Final diagnoses:  None      This chart was dictated using voice recognition software.  Despite best efforts to proofread,  errors can occur which can change meaning.     Jeanmarie Plant, MD 08/02/15 1027  Jeanmarie Plant, MD 08/02/15 [phone]

## 2018-10-14 ENCOUNTER — Ambulatory Visit: Admission: EM | Admit: 2018-10-14 | Discharge: 2018-10-14 | Discharge status: Against Medical Advice | Payer: Self-pay

## 2021-08-22 ENCOUNTER — Encounter: Payer: Self-pay | Admitting: Family Medicine
